# Patient Record
Sex: Female | Born: 1975 | Hispanic: Yes | Marital: Married | Smoking: Never smoker
Health system: Southern US, Community
[De-identification: ages and names within clinical notes are randomized; demographics above are authoritative.]

## PROBLEM LIST (undated history)

## (undated) DIAGNOSIS — I739 Peripheral vascular disease, unspecified: Secondary | ICD-10-CM

## (undated) HISTORY — PX: BREAST BIOPSY: SHX20

## (undated) HISTORY — DX: Peripheral vascular disease, unspecified: I73.9

---

## 2006-07-28 ENCOUNTER — Inpatient Hospital Stay: Payer: Self-pay | Admitting: Obstetrics and Gynecology

## 2006-08-21 ENCOUNTER — Emergency Department: Payer: Self-pay | Admitting: Emergency Medicine

## 2010-07-05 ENCOUNTER — Inpatient Hospital Stay: Payer: Self-pay | Admitting: Obstetrics and Gynecology

## 2011-11-25 ENCOUNTER — Ambulatory Visit: Payer: Self-pay | Admitting: Obstetrics and Gynecology

## 2016-01-16 ENCOUNTER — Other Ambulatory Visit: Payer: Self-pay | Admitting: Obstetrics and Gynecology

## 2016-01-16 DIAGNOSIS — Z1231 Encounter for screening mammogram for malignant neoplasm of breast: Secondary | ICD-10-CM

## 2016-01-29 ENCOUNTER — Ambulatory Visit
Admission: RE | Admit: 2016-01-29 | Discharge: 2016-01-29 | Disposition: A | Discharge status: Home / Self Care | Payer: Managed Care, Other (non HMO) | Source: Ambulatory Visit | Attending: Obstetrics and Gynecology | Admitting: Obstetrics and Gynecology

## 2016-01-29 DIAGNOSIS — Z1231 Encounter for screening mammogram for malignant neoplasm of breast: Secondary | ICD-10-CM | POA: Diagnosis present

## 2016-02-04 ENCOUNTER — Other Ambulatory Visit: Payer: Self-pay | Admitting: Obstetrics and Gynecology

## 2016-02-04 DIAGNOSIS — N6489 Other specified disorders of breast: Secondary | ICD-10-CM

## 2016-02-18 ENCOUNTER — Ambulatory Visit
Admission: RE | Admit: 2016-02-18 | Discharge: 2016-02-18 | Disposition: A | Discharge status: Home / Self Care | Payer: Managed Care, Other (non HMO) | Source: Ambulatory Visit | Attending: Obstetrics and Gynecology | Admitting: Obstetrics and Gynecology

## 2016-02-18 DIAGNOSIS — N6489 Other specified disorders of breast: Secondary | ICD-10-CM | POA: Diagnosis not present

## 2016-12-09 ENCOUNTER — Other Ambulatory Visit: Payer: Self-pay | Admitting: Internal Medicine

## 2016-12-09 DIAGNOSIS — R1032 Left lower quadrant pain: Secondary | ICD-10-CM

## 2016-12-29 ENCOUNTER — Ambulatory Visit
Admission: RE | Admit: 2016-12-29 | Discharge: 2016-12-29 | Disposition: A | Discharge status: Home / Self Care | Payer: Managed Care, Other (non HMO) | Source: Ambulatory Visit | Attending: Internal Medicine | Admitting: Internal Medicine

## 2016-12-29 DIAGNOSIS — D259 Leiomyoma of uterus, unspecified: Secondary | ICD-10-CM | POA: Diagnosis not present

## 2016-12-29 DIAGNOSIS — K439 Ventral hernia without obstruction or gangrene: Secondary | ICD-10-CM | POA: Insufficient documentation

## 2016-12-29 DIAGNOSIS — R109 Unspecified abdominal pain: Secondary | ICD-10-CM | POA: Diagnosis present

## 2016-12-29 DIAGNOSIS — R1032 Left lower quadrant pain: Secondary | ICD-10-CM

## 2016-12-29 MED ORDER — IOPAMIDOL (ISOVUE-300) INJECTION 61%
100.0000 mL | Freq: Once | INTRAVENOUS | Status: AC | PRN
  Administered 2016-12-29: 100 mL via INTRAVENOUS

## 2017-01-28 ENCOUNTER — Other Ambulatory Visit: Payer: Self-pay | Admitting: Obstetrics and Gynecology

## 2017-01-28 DIAGNOSIS — Z1231 Encounter for screening mammogram for malignant neoplasm of breast: Secondary | ICD-10-CM

## 2017-02-19 ENCOUNTER — Ambulatory Visit
Admission: RE | Admit: 2017-02-19 | Discharge: 2017-02-19 | Disposition: A | Discharge status: Home / Self Care | Payer: Managed Care, Other (non HMO) | Source: Ambulatory Visit | Attending: Obstetrics and Gynecology | Admitting: Obstetrics and Gynecology

## 2017-02-19 DIAGNOSIS — Z1231 Encounter for screening mammogram for malignant neoplasm of breast: Secondary | ICD-10-CM | POA: Diagnosis present

## 2017-11-30 ENCOUNTER — Encounter (INDEPENDENT_AMBULATORY_CARE_PROVIDER_SITE_OTHER): Payer: Self-pay | Admitting: Vascular Surgery

## 2017-11-30 ENCOUNTER — Encounter (INDEPENDENT_AMBULATORY_CARE_PROVIDER_SITE_OTHER): Payer: Self-pay

## 2017-11-30 ENCOUNTER — Ambulatory Visit (INDEPENDENT_AMBULATORY_CARE_PROVIDER_SITE_OTHER): Payer: 59 | Admitting: Vascular Surgery

## 2017-11-30 DIAGNOSIS — I8393 Asymptomatic varicose veins of bilateral lower extremities: Secondary | ICD-10-CM

## 2017-12-01 ENCOUNTER — Encounter (INDEPENDENT_AMBULATORY_CARE_PROVIDER_SITE_OTHER): Payer: Self-pay | Admitting: Vascular Surgery

## 2017-12-01 DIAGNOSIS — I8393 Asymptomatic varicose veins of bilateral lower extremities: Secondary | ICD-10-CM | POA: Insufficient documentation

## 2017-12-01 NOTE — Progress Notes (Signed)
Patient was interested in cosmetic sclerotherapy.  As of August 18, 2017 our office does not do cosmetic sclerotherapy.  The patient was not interested in undergoing a official venous workup for her lower extremity.

## 2018-02-10 ENCOUNTER — Other Ambulatory Visit: Payer: Self-pay | Admitting: Obstetrics and Gynecology

## 2018-02-10 DIAGNOSIS — Z1231 Encounter for screening mammogram for malignant neoplasm of breast: Secondary | ICD-10-CM

## 2018-02-25 ENCOUNTER — Ambulatory Visit
Admission: RE | Admit: 2018-02-25 | Discharge: 2018-02-25 | Disposition: A | Discharge status: Home / Self Care | Payer: 59 | Source: Ambulatory Visit | Attending: Obstetrics and Gynecology | Admitting: Obstetrics and Gynecology

## 2018-02-25 DIAGNOSIS — Z1231 Encounter for screening mammogram for malignant neoplasm of breast: Secondary | ICD-10-CM | POA: Diagnosis not present

## 2019-02-17 ENCOUNTER — Other Ambulatory Visit: Payer: Self-pay | Admitting: Obstetrics and Gynecology

## 2019-02-17 DIAGNOSIS — Z1231 Encounter for screening mammogram for malignant neoplasm of breast: Secondary | ICD-10-CM

## 2019-03-28 ENCOUNTER — Ambulatory Visit
Admission: RE | Admit: 2019-03-28 | Discharge: 2019-03-28 | Disposition: A | Discharge status: Home / Self Care | Payer: 59 | Source: Ambulatory Visit | Attending: Obstetrics and Gynecology | Admitting: Obstetrics and Gynecology

## 2019-03-28 ENCOUNTER — Other Ambulatory Visit: Payer: Self-pay

## 2019-03-28 DIAGNOSIS — Z1231 Encounter for screening mammogram for malignant neoplasm of breast: Secondary | ICD-10-CM

## 2020-02-22 ENCOUNTER — Other Ambulatory Visit: Payer: Self-pay | Admitting: Obstetrics and Gynecology

## 2020-02-22 DIAGNOSIS — Z1231 Encounter for screening mammogram for malignant neoplasm of breast: Secondary | ICD-10-CM

## 2020-03-29 ENCOUNTER — Other Ambulatory Visit: Payer: Self-pay

## 2020-03-29 ENCOUNTER — Ambulatory Visit
Admission: RE | Admit: 2020-03-29 | Discharge: 2020-03-29 | Disposition: A | Discharge status: Home / Self Care | Payer: 59 | Source: Ambulatory Visit | Attending: Obstetrics and Gynecology | Admitting: Obstetrics and Gynecology

## 2020-03-29 DIAGNOSIS — Z1231 Encounter for screening mammogram for malignant neoplasm of breast: Secondary | ICD-10-CM | POA: Insufficient documentation

## 2021-07-10 ENCOUNTER — Other Ambulatory Visit: Payer: Self-pay | Admitting: Obstetrics and Gynecology

## 2021-11-20 ENCOUNTER — Other Ambulatory Visit: Payer: Self-pay | Admitting: Internal Medicine

## 2021-11-20 DIAGNOSIS — Z1231 Encounter for screening mammogram for malignant neoplasm of breast: Secondary | ICD-10-CM

## 2021-12-26 ENCOUNTER — Ambulatory Visit
Admission: RE | Admit: 2021-12-26 | Discharge: 2021-12-26 | Disposition: A | Discharge status: Home / Self Care | Payer: BC Managed Care – PPO | Source: Ambulatory Visit | Attending: Internal Medicine | Admitting: Internal Medicine

## 2021-12-26 DIAGNOSIS — Z1231 Encounter for screening mammogram for malignant neoplasm of breast: Secondary | ICD-10-CM | POA: Insufficient documentation

## 2022-01-31 LAB — COLOGUARD: COLOGUARD: NEGATIVE

## 2023-02-25 ENCOUNTER — Other Ambulatory Visit: Payer: Self-pay | Admitting: Internal Medicine

## 2023-02-25 DIAGNOSIS — Z1231 Encounter for screening mammogram for malignant neoplasm of breast: Secondary | ICD-10-CM

## 2023-03-04 ENCOUNTER — Ambulatory Visit
Admission: RE | Admit: 2023-03-04 | Discharge: 2023-03-04 | Disposition: A | Discharge status: Home / Self Care | Payer: BC Managed Care – PPO | Source: Ambulatory Visit | Attending: Internal Medicine | Admitting: Internal Medicine

## 2023-03-04 DIAGNOSIS — Z1231 Encounter for screening mammogram for malignant neoplasm of breast: Secondary | ICD-10-CM | POA: Diagnosis present

## 2023-05-03 ENCOUNTER — Emergency Department: Payer: BC Managed Care – PPO

## 2023-05-03 ENCOUNTER — Other Ambulatory Visit: Payer: Self-pay

## 2023-05-03 ENCOUNTER — Emergency Department
Admission: EM | Admit: 2023-05-03 | Discharge: 2023-05-03 | Disposition: A | Discharge status: Home / Self Care | Payer: BC Managed Care – PPO | Attending: Emergency Medicine | Admitting: Emergency Medicine

## 2023-05-03 DIAGNOSIS — K8020 Calculus of gallbladder without cholecystitis without obstruction: Secondary | ICD-10-CM | POA: Diagnosis not present

## 2023-05-03 DIAGNOSIS — R1084 Generalized abdominal pain: Secondary | ICD-10-CM

## 2023-05-03 DIAGNOSIS — R109 Unspecified abdominal pain: Secondary | ICD-10-CM | POA: Diagnosis present

## 2023-05-03 LAB — COMPREHENSIVE METABOLIC PANEL
ALT: 20 U/L (ref 0–44)
AST: 17 U/L (ref 15–41)
Albumin: 4.2 g/dL (ref 3.5–5.0)
Alkaline Phosphatase: 80 U/L (ref 38–126)
Anion gap: 10 (ref 5–15)
BUN: 16 mg/dL (ref 6–20)
CO2: 25 mmol/L (ref 22–32)
Calcium: 8.9 mg/dL (ref 8.9–10.3)
Chloride: 100 mmol/L (ref 98–111)
Creatinine, Ser: 0.67 mg/dL (ref 0.44–1.00)
GFR, Estimated: >60 mL/min (ref >60)
Glucose, Bld: 141 mg/dL — ABNORMAL HIGH (ref 70–99)
Potassium: 3.9 mmol/L (ref 3.5–5.1)
Sodium: 135 mmol/L (ref 135–145)
Total Bilirubin: 0.5 mg/dL (ref 0.3–1.2)
Total Protein: 7.6 g/dL (ref 6.5–8.1)

## 2023-05-03 LAB — URINALYSIS, ROUTINE W REFLEX MICROSCOPIC
Bacteria, UA: NONE SEEN
Bilirubin Urine: NEGATIVE
Glucose, UA: NEGATIVE mg/dL
Hgb urine dipstick: NEGATIVE
Ketones, ur: NEGATIVE mg/dL
Nitrite: NEGATIVE
Protein, ur: NEGATIVE mg/dL
Specific Gravity, Urine: 1.02 (ref 1.005–1.030)
pH: 5 (ref 5.0–8.0)

## 2023-05-03 LAB — CBC
HCT: 40.3 % (ref 36.0–46.0)
Hemoglobin: 12.8 g/dL (ref 12.0–15.0)
MCH: 28.3 pg (ref 26.0–34.0)
MCHC: 31.8 g/dL (ref 30.0–36.0)
MCV: 89 fL (ref 80.0–100.0)
Platelets: 216 10*3/uL (ref 150–400)
RBC: 4.53 MIL/uL (ref 3.87–5.11)
RDW: 12.9 % (ref 11.5–15.5)
WBC: 7.8 10*3/uL (ref 4.0–10.5)
nRBC: 0 % (ref 0.0–0.2)

## 2023-05-03 LAB — POC URINE PREG, ED: Preg Test, Ur: NEGATIVE

## 2023-05-03 LAB — LIPASE, BLOOD: Lipase: 32 U/L (ref 11–51)

## 2023-05-03 MED ORDER — CEPHALEXIN 500 MG PO CAPS: 500.0000 mg | ORAL_CAPSULE | Freq: Two times a day (BID) | ORAL | 0 refills | Status: AC

## 2023-05-03 MED ORDER — ALUM & MAG HYDROXIDE-SIMETH 200-200-20 MG/5ML PO SUSP
30.0000 mL | Freq: Once | ORAL | Status: AC
  Administered 2023-05-03: 30 mL via ORAL
  Filled 2023-05-03: qty 30

## 2023-05-03 MED ORDER — ONDANSETRON 4 MG PO TBDP
4.0000 mg | ORAL_TABLET | Freq: Once | ORAL | Status: AC
  Administered 2023-05-03: 4 mg via ORAL
  Filled 2023-05-03: qty 1

## 2023-05-03 MED ORDER — PANTOPRAZOLE SODIUM 40 MG PO TBEC: 40.0000 mg | DELAYED_RELEASE_TABLET | Freq: Every day | ORAL | 0 refills | Status: AC

## 2023-05-03 MED ORDER — LIDOCAINE VISCOUS HCL 2 % MT SOLN
15.0000 mL | Freq: Once | OROMUCOSAL | Status: AC
  Administered 2023-05-03: 15 mL via OROMUCOSAL
  Filled 2023-05-03: qty 15

## 2023-05-03 MED ORDER — ONDANSETRON 4 MG PO TBDP: 4.0000 mg | ORAL_TABLET | Freq: Three times a day (TID) | ORAL | 0 refills | Status: AC | PRN

## 2023-05-03 NOTE — ED Triage Notes (Signed)
Central/lower abd pain since 2200 last night. Reports no n/v. Reports bilateral flank pain as well. Denies urinary symptoms. Pt ambulatory to triage. Alert and oriented following commands. Breathing unlabored speaking in full sentences.

## 2023-05-03 NOTE — ED Provider Notes (Signed)
[facility] Provider Note    Event Date/Time   First MD Initiated Contact with Patient 05/03/23 0515     (approximate)   History   Abdominal Pain   HPI  Anna Rivas is a 47 y.o. female  here with abdominal discomfort. Pt states that she just started and took the first dose of doxycycline for acne. Reports she then went to a cookout and had increasingly severe epigastric abdominal discomfort, nausea, then NBNB emesis. She has had ongoing diffuse abd discomfort since then. She feels a fullness sensation. No overt chest pain. No other complaints. No known sick contacts. No diarrhea.      Physical Exam   Triage Vital Signs: ED Triage Vitals  Encounter Vitals Group     BP 05/03/23 0345 130/81     Systolic BP Percentile --      Diastolic BP Percentile --      Pulse Rate 05/03/23 0345 80     Resp 05/03/23 0345 18     Temp 05/03/23 0347 97.8 F (36.6 C)     Temp Source 05/03/23 0345 Oral     SpO2 05/03/23 0345 100 %     Weight 05/03/23 0343 160 lb (72.6 kg)     Height 05/03/23 0343 5' (1.524 m)     Head Circumference --      Peak Flow --      Pain Score 05/03/23 0343 8     Pain Loc --      Pain Education --      Exclude from Growth Chart --     Most recent vital signs: Vitals:   05/03/23 0700 05/03/23 0730  BP: 112/72 114/74  Pulse: 93 (!) 103  Resp:    Temp:    SpO2: 100% 99%     General: Awake, no distress.  CV:  Good peripheral perfusion. RRR. Resp:  Normal work of breathing. Lungs clear bilaterally. Abd:  No distention. Mild epigastric, RUQ TTP though no overt Murphy's. Other:  No le edema.   ED Results / Procedures / Treatments   Labs (all labs ordered are listed, but only abnormal results are displayed) Labs Reviewed  COMPREHENSIVE METABOLIC PANEL - Abnormal; Notable for the following components:      Result Value   Glucose, Bld 141 (*)    All other components within normal limits  URINALYSIS, ROUTINE W REFLEX MICROSCOPIC  - Abnormal; Notable for the following components:   Color, Urine YELLOW (*)    APPearance CLEAR (*)    Leukocytes,Ua TRACE (*)    All other components within normal limits  LIPASE, BLOOD  CBC  POC URINE PREG, ED     EKG    RADIOLOGY [place] RUQ: Cholelithiasis, no acute cholecystitis   I also independently reviewed and agree with radiologist interpretations.   PROCEDURES:  Critical Care performed: No   MEDICATIONS ORDERED IN ED: Medications  alum & mag hydroxide-simeth (MAALOX/MYLANTA) 200-200-20 MG/5ML suspension 30 mL (30 mLs Oral Given 05/03/23 0651)  lidocaine (XYLOCAINE) 2 % viscous mouth solution 15 mL (15 mLs Mouth/Throat Given 05/03/23 0651)  ondansetron (ZOFRAN-ODT) disintegrating tablet 4 mg (4 mg Oral Given 05/03/23 0651)     IMPRESSION / MDM / ASSESSMENT AND PLAN / ED COURSE  I reviewed the triage vital signs and the nursing notes.                              Differential diagnosis  includes, but is not limited to, pill esophagitis, gastritis, cholecystitis, biliary colic, enteritis, atypical angina  Patient's presentation is most consistent with acute presentation with potential threat to life or bodily function.  The patient is on the cardiac monitor to evaluate for evidence of arrhythmia and/or significant heart rate changes  47 yo F here with abdominal pain, nausea, vomiting. Suspect sx of esophagitis in setting of starting doxycycline. Pain improved with GI cocktiail. Will switch her to Keflex. No signs of significant UGIB. Abdomen is soft and nontender. She does have gallstones on U/S but no RUQ pain, negative Murphy's, normal LFTs, normal Bili, do not suspect symptomatic gallstones or cholecystitis. Will have her stop doxy, start PPI and refer for outpt surgery f/u.    FINAL CLINICAL IMPRESSION(S) / ED DIAGNOSES   Final diagnoses:  Generalized abdominal pain  Gallstones     Rx / DC Orders   ED Discharge Orders          Ordered    pantoprazole  (PROTONIX) 40 MG tablet  Daily        05/03/23 0750    ondansetron (ZOFRAN-ODT) 4 MG disintegrating tablet  Every 8 hours PRN        05/03/23 0750    cephALEXin (KEFLEX) 500 MG capsule  2 times daily        05/03/23 0750             Note:  This document was prepared using Dragon voice recognition software and may include unintentional dictation errors.   Shaune Pollack, MD 05/03/23 [id number]

## 2023-05-03 NOTE — Discharge Instructions (Addendum)
STOP the doxycycline START the Keflex instead  Take the medications as prescribed  If your pain returns, or recurs, call Surgery for follow-up of gallstones

## 2023-12-11 IMAGING — MG MM DIGITAL SCREENING BILAT W/ TOMO AND CAD
6 of 10 series · 6 of 30 positions shown · non-contrast
Comparison: Previous exam(s).

CLINICAL DATA: Screening.

EXAM:
DIGITAL SCREENING BILATERAL MAMMOGRAM WITH TOMOSYNTHESIS AND CAD
TECHNIQUE: Bilateral screening digital craniocaudal and mediolateral oblique
mammograms were obtained. Bilateral screening digital breast
tomosynthesis was performed. The images were evaluated with
computer-aided detection.

[R MLO synth-2D]
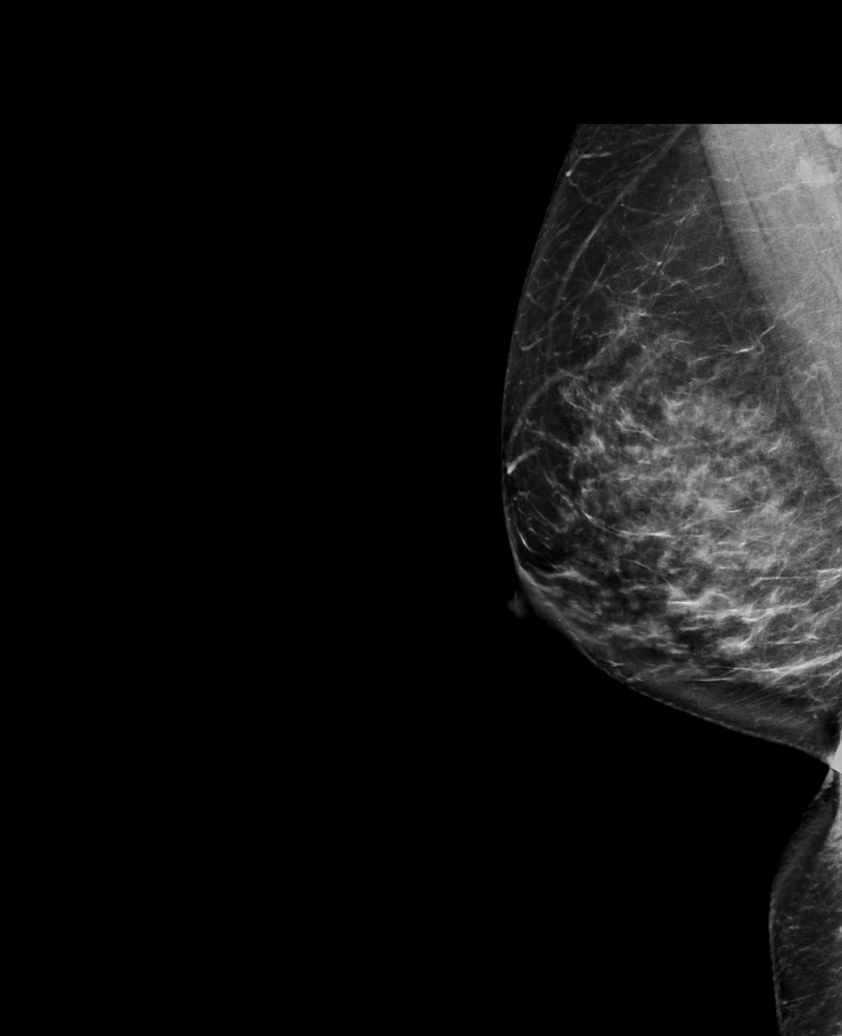

[R CC synth-2D]
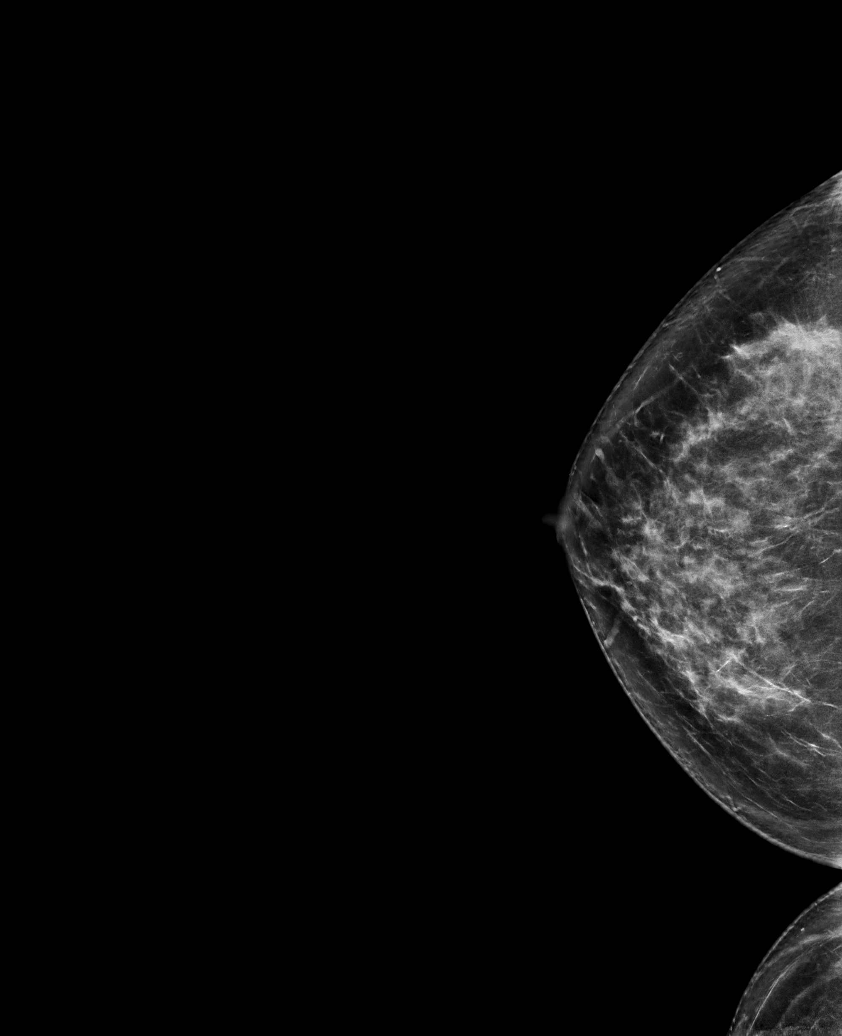

[L MLO synth-2D (1 of 2)]
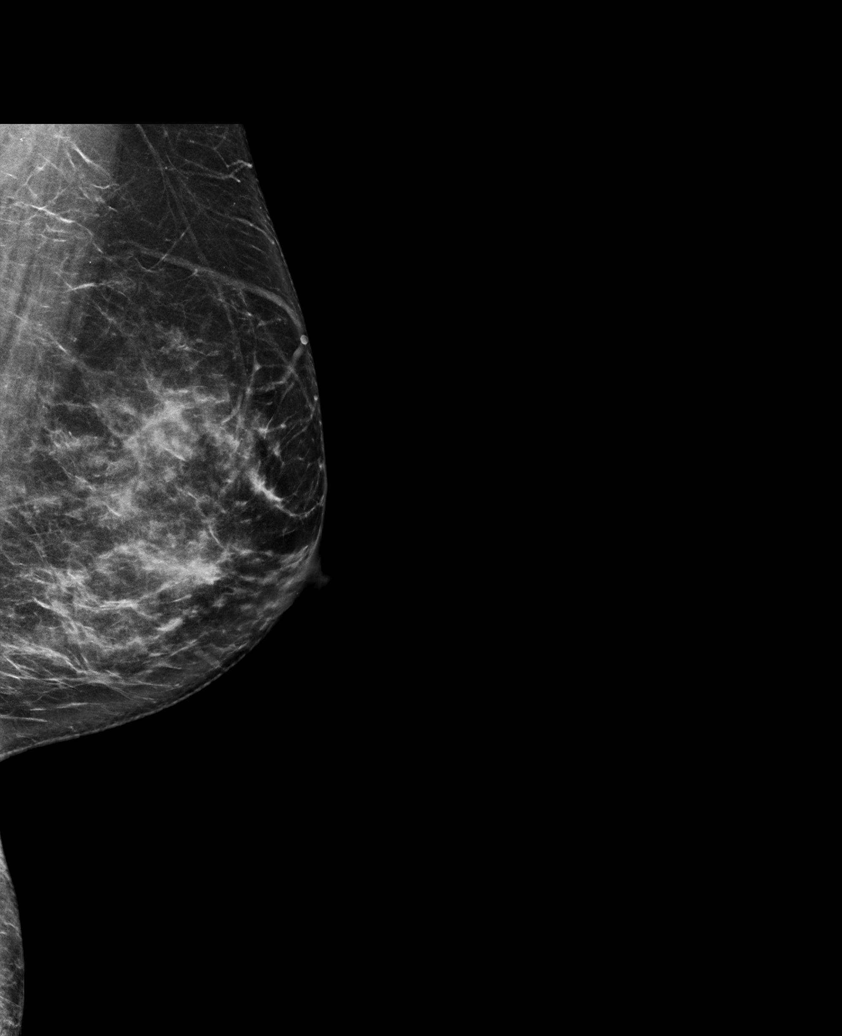

[L MLO synth-2D (2 of 2)]
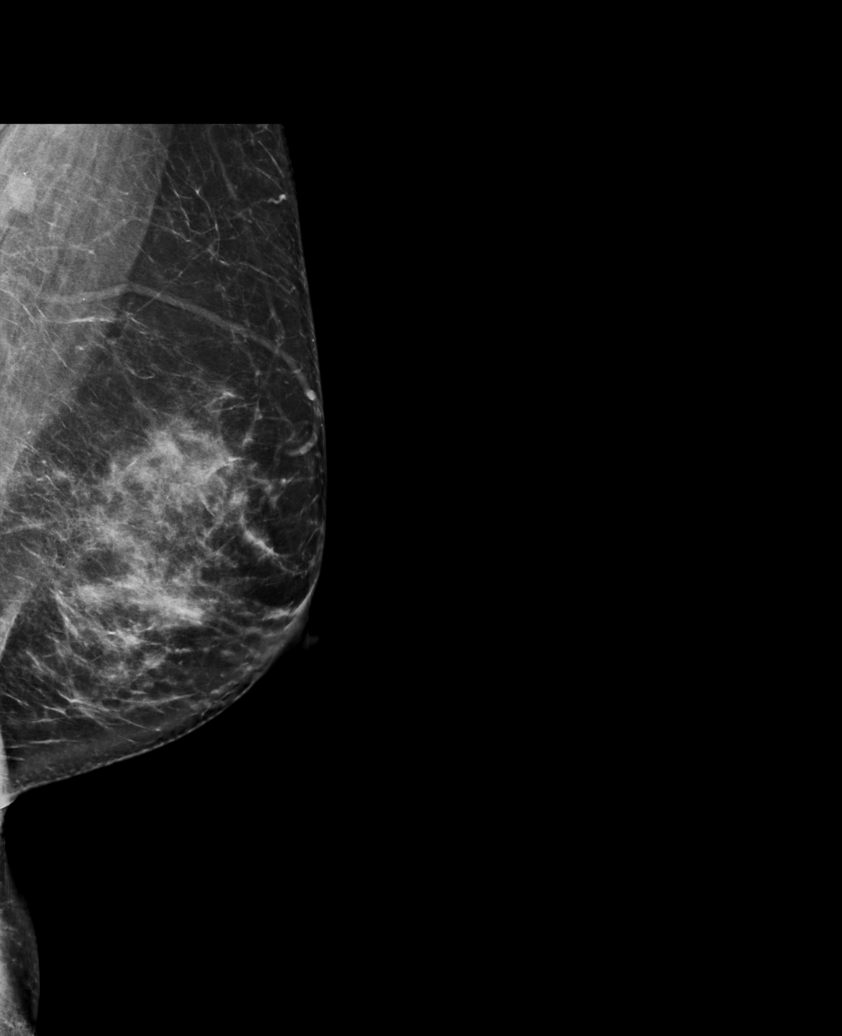

[L CC synth-2D]
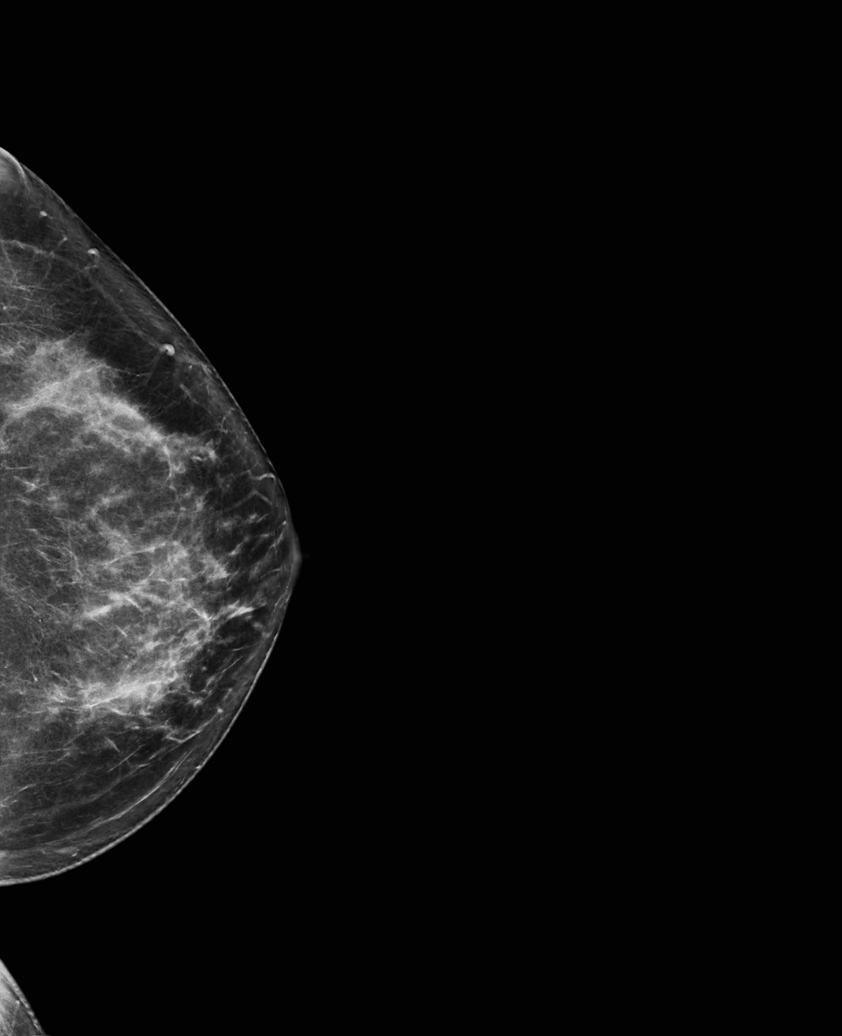

[L MLO tomo · tomo slice 41/80.0]
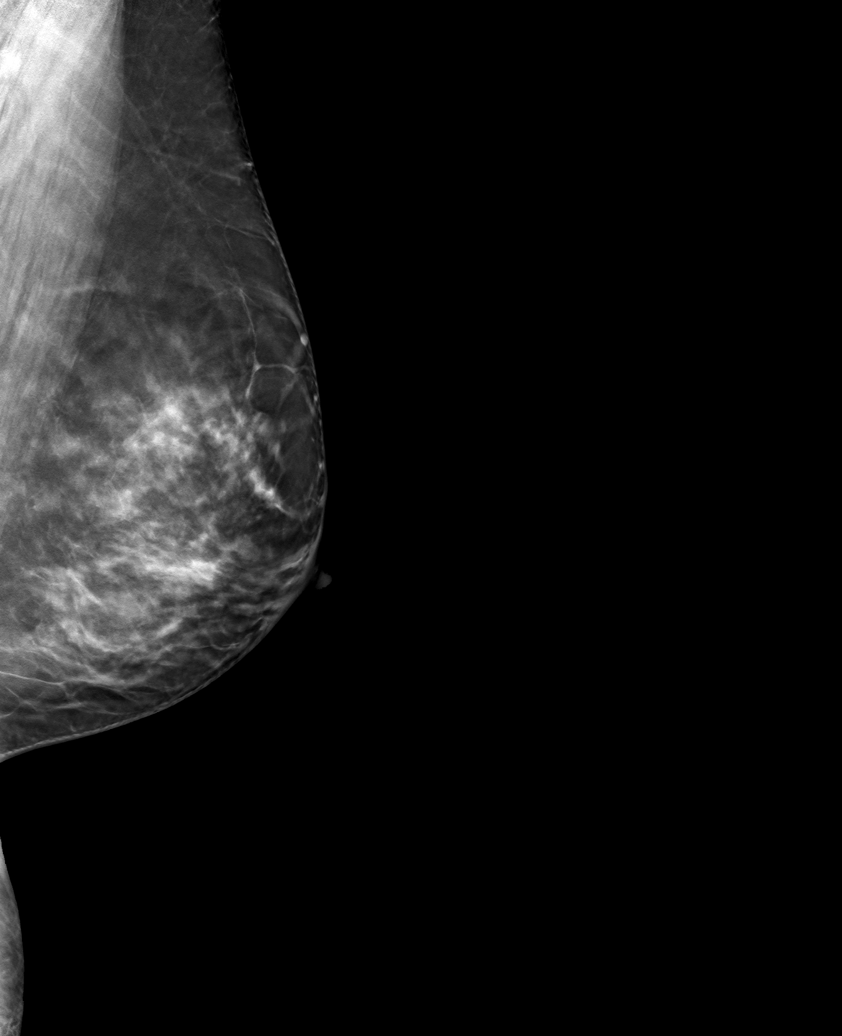

[6 of 30 positions shown; findings below may reference images not displayed]

ACR Breast Density Category c: The breast tissue is heterogeneously
dense, which may obscure small masses.
FINDINGS: There are no findings suspicious for malignancy.
IMPRESSION: No mammographic evidence of malignancy. A result letter of this
screening mammogram will be mailed directly to the patient.

RECOMMENDATION:
Screening mammogram in one year. (Code:Q3-W-BC3)

BI-RADS CATEGORY  1: Negative.

## 2024-05-04 ENCOUNTER — Other Ambulatory Visit: Payer: Self-pay | Admitting: Internal Medicine

## 2024-05-04 DIAGNOSIS — Z1231 Encounter for screening mammogram for malignant neoplasm of breast: Secondary | ICD-10-CM

## 2024-05-25 ENCOUNTER — Ambulatory Visit
Admission: RE | Admit: 2024-05-25 | Discharge: 2024-05-25 | Disposition: A | Discharge status: Home / Self Care | Source: Ambulatory Visit | Attending: Internal Medicine | Admitting: Internal Medicine

## 2024-05-25 DIAGNOSIS — Z1231 Encounter for screening mammogram for malignant neoplasm of breast: Secondary | ICD-10-CM | POA: Insufficient documentation

## 2024-06-01 ENCOUNTER — Other Ambulatory Visit: Payer: Self-pay | Admitting: Internal Medicine

## 2024-06-01 DIAGNOSIS — R928 Other abnormal and inconclusive findings on diagnostic imaging of breast: Secondary | ICD-10-CM

## 2024-06-08 ENCOUNTER — Ambulatory Visit
Admission: RE | Admit: 2024-06-08 | Discharge: 2024-06-08 | Disposition: A | Discharge status: Home / Self Care | Source: Ambulatory Visit | Attending: Internal Medicine | Admitting: Internal Medicine

## 2024-06-08 DIAGNOSIS — R928 Other abnormal and inconclusive findings on diagnostic imaging of breast: Secondary | ICD-10-CM | POA: Diagnosis present

## 2024-06-09 ENCOUNTER — Other Ambulatory Visit: Payer: Self-pay | Admitting: Internal Medicine

## 2024-06-09 DIAGNOSIS — R928 Other abnormal and inconclusive findings on diagnostic imaging of breast: Secondary | ICD-10-CM

## 2024-06-21 ENCOUNTER — Other Ambulatory Visit

## 2024-06-21 ENCOUNTER — Encounter

## 2024-07-05 ENCOUNTER — Ambulatory Visit
Admission: RE | Admit: 2024-07-05 | Discharge: 2024-07-05 | Disposition: A | Discharge status: Home / Self Care | Source: Ambulatory Visit | Attending: Internal Medicine | Admitting: Internal Medicine

## 2024-07-05 DIAGNOSIS — R928 Other abnormal and inconclusive findings on diagnostic imaging of breast: Secondary | ICD-10-CM | POA: Insufficient documentation

## 2024-07-05 DIAGNOSIS — D241 Benign neoplasm of right breast: Secondary | ICD-10-CM | POA: Insufficient documentation

## 2024-07-05 HISTORY — PX: BREAST BIOPSY: SHX20

## 2024-07-05 MED ORDER — LIDOCAINE 1 % OPTIME INJ - NO CHARGE
2.0000 mL | Freq: Once | INTRAMUSCULAR | Status: AC
  Administered 2024-07-05: 2 mL via INTRADERMAL
  Filled 2024-07-05: qty 2

## 2024-07-05 MED ORDER — LIDOCAINE-EPINEPHRINE 1 %-1:100000 IJ SOLN
10.0000 mL | Freq: Once | INTRAMUSCULAR | Status: AC
  Administered 2024-07-05: 10 mL via INTRADERMAL
  Filled 2024-07-05: qty 10

## 2024-07-06 LAB — SURGICAL PATHOLOGY
